# Patient Record
Sex: Male | Born: 1980 | Race: Black or African American | Hispanic: No | Marital: Single | Smoking: Current some day smoker
Health system: Southern US, Community
[De-identification: ages and names within clinical notes are randomized; demographics above are authoritative.]

---

## 2003-11-06 ENCOUNTER — Emergency Department: Payer: Self-pay | Admitting: Emergency Medicine

## 2003-11-13 ENCOUNTER — Emergency Department: Payer: Self-pay | Admitting: Emergency Medicine

## 2004-11-05 ENCOUNTER — Emergency Department: Payer: Self-pay | Admitting: Unknown Physician Specialty

## 2004-12-16 ENCOUNTER — Emergency Department: Payer: Self-pay | Admitting: Emergency Medicine

## 2007-07-06 ENCOUNTER — Emergency Department: Payer: Self-pay | Admitting: Emergency Medicine

## 2008-06-17 ENCOUNTER — Emergency Department: Payer: Self-pay | Admitting: Unknown Physician Specialty

## 2008-12-19 ENCOUNTER — Emergency Department: Payer: Self-pay | Admitting: Emergency Medicine

## 2012-02-10 ENCOUNTER — Emergency Department: Payer: Self-pay | Admitting: Emergency Medicine

## 2013-04-15 ENCOUNTER — Emergency Department: Payer: Self-pay | Admitting: Emergency Medicine

## 2014-03-31 ENCOUNTER — Emergency Department: Payer: Self-pay | Admitting: Emergency Medicine

## 2014-04-20 ENCOUNTER — Emergency Department
Admit: 2014-04-20 | Disposition: A | Discharge status: Home / Self Care | Payer: Self-pay | Admitting: Emergency Medicine

## 2014-06-20 ENCOUNTER — Emergency Department
Admission: EM | Admit: 2014-06-20 | Discharge: 2014-06-20 | Disposition: A | Discharge status: Home / Self Care | Payer: Self-pay | Attending: Emergency Medicine | Admitting: Emergency Medicine

## 2014-06-20 ENCOUNTER — Encounter: Payer: Self-pay | Admitting: Emergency Medicine

## 2014-06-20 DIAGNOSIS — Z720 Tobacco use: Secondary | ICD-10-CM | POA: Insufficient documentation

## 2014-06-20 DIAGNOSIS — H6122 Impacted cerumen, left ear: Secondary | ICD-10-CM | POA: Insufficient documentation

## 2014-06-20 NOTE — Discharge Instructions (Signed)
USE OTC Debrox for ear was removal. Cerumen Impaction A cerumen impaction is when the wax in your ear forms a plug. This plug usually causes reduced hearing. Sometimes it also causes an earache or dizziness. Removing a cerumen impaction can be difficult and painful. The wax sticks to the ear canal. The canal is sensitive and bleeds easily. If you try to remove a heavy wax buildup with a cotton tipped swab, you may push it in further. Irrigation with water, suction, and small ear curettes may be used to clear out the wax. If the impaction is fixed to the skin in the ear canal, ear drops may be needed for a few days to loosen the wax. People who build up a lot of wax frequently can use ear wax removal products available in your local drugstore. SEEK MEDICAL CARE IF:  You develop an earache, increased hearing loss, or marked dizziness. Document Released: 02/15/2004 Document Revised: 04/01/2011 Document Reviewed: 04/06/2009 [facility] Patient Information 2015 [place], [place]. This information is not intended to replace advice given to you by your health care provider. Make sure you discuss any questions you have with your health care provider.

## 2014-06-20 NOTE — ED Notes (Signed)
Left ear irrigated with warm saline  Tolerated well  Mod amt of wax retruned

## 2014-06-20 NOTE — ED Notes (Signed)
C/o left ear pain and difficulty hearing out of left ear

## 2014-06-20 NOTE — ED Provider Notes (Signed)
[facility] Emergency Department Provider Note  ____________________________________________  Time seen: Approximately 11:03 AM  I have reviewed the triage vital signs and the nursing notes.   HISTORY  Chief Complaint Otalgia    HPI Evan Martin is a 34 y.o. male complaining of 4 days of decreased hearing and pain in the left ear. Patient denies any URI signs symptoms. Patient is rating his pain as a 10 over 10. Patient stated he has been no palliative or provocative incidents for his complaint. She describes pain as sharp and a fullness in his ear.   History reviewed. No pertinent past medical history.  There are no active problems to display for this patient.   History reviewed. No pertinent past surgical history.  No current outpatient prescriptions on file.  Allergies Review of patient's allergies indicates no known allergies.  History reviewed. No pertinent family history.  Social History History  Substance Use Topics  . Smoking status: Current Some Day Smoker  . Smokeless tobacco: Not on file  . Alcohol Use: Yes    Review of Systems Constitutional: No fever/chills Eyes: No visual changes. ENT: No sore throat. States decreased affected left ear only. Cardiovascular: Denies chest pain. Respiratory: Denies shortness of breath. Gastrointestinal: No abdominal pain.  No nausea, no vomiting.  No diarrhea.  No constipation. Genitourinary: Negative for dysuria. Musculoskeletal: Negative for back pain. Skin: Negative for rash. Neurological: Negative for headaches, focal weakness or numbness. 10-point ROS otherwise negative.  ____________________________________________   PHYSICAL EXAM:  VITAL SIGNS: ED Triage Vitals  Enc Vitals Group     BP 06/20/14 1039 112/68 mmHg     Pulse Rate 06/20/14 1039 64     Resp 06/20/14 1039 18     Temp 06/20/14 1039 98.4 F (36.9 C)     Temp Source 06/20/14 1039 Oral     SpO2 06/20/14 1039 97 %    Weight 06/20/14 1039 187 lb (84.823 kg)     Height 06/20/14 1039  (1.905 m)     Head Cir --      Peak Flow --      Pain Score 06/20/14 1037 10     Pain Loc --      Pain Edu? --      Excl. in GC? --     Constitutional: Alert and oriented. Well appearing and in no acute distress. Eyes: Conjunctivae are normal. PERRL. EOMI. Head: Atraumatic. Nose: No congestion/rhinnorhea. Mouth/Throat: Mucous membranes are moist.  Oropharynx non-erythematous. Left ear canal with buildup of wax TM not visible. Right ear canal and TM are unremarkable. Neck: No stridor.  No cervical deformity for nuchal range of motion nontender palpation. Hematological/Lymphatic/Immunilogical: No cervical lymphadenopathy. Cardiovascular: Normal rate, regular rhythm. Grossly normal heart sounds.  Good peripheral circulation. Respiratory: Normal respiratory effort.  No retractions. Lungs CTAB. Gastrointestinal: Soft and nontender. No distention. No abdominal bruits. No CVA tenderness. Musculoskeletal: No lower extremity tenderness nor edema.  No joint effusions. Neurologic:  Normal speech and language. No gross focal neurologic deficits are appreciated. Speech is normal. No gait instability. Skin:  Skin is warm, dry and intact. No rash noted. Psychiatric: Mood and affect are normal. Speech and behavior are normal.  ____________________________________________   LABS (all labs ordered are listed, but only abnormal results are displayed)  Labs Reviewed - No data to display ____________________________________________  EKG   ____________________________________________  RADIOLOGY   ____________________________________________   PROCEDURES  Procedure(s) performed: None  Critical Care performed: No  ____________________________________________  INITIAL IMPRESSION / ASSESSMENT AND PLAN / ED COURSE  Pertinent labs & imaging results that were available during my care of the patient were reviewed by me  and considered in my medical decision making (see chart for details).  Cerumen impaction ____________________________________________   FINAL CLINICAL IMPRESSION(S) / ED DIAGNOSES  Final diagnoses:  Cerumen impaction, left      Evan ReiningRonald K Magali Bray, PA-C 06/20/14 1112  Phineas SemenGraydon Goodman, MD 06/20/14 1124

## 2014-07-05 ENCOUNTER — Ambulatory Visit: Payer: Self-pay | Attending: Orthopedic Surgery | Admitting: Occupational Therapy

## 2014-07-07 ENCOUNTER — Ambulatory Visit: Payer: Self-pay | Admitting: Occupational Therapy

## 2015-10-14 ENCOUNTER — Emergency Department
Admission: EM | Admit: 2015-10-14 | Discharge: 2015-10-14 | Disposition: A | Discharge status: Home / Self Care | Payer: PRIVATE HEALTH INSURANCE | Attending: Emergency Medicine | Admitting: Emergency Medicine

## 2015-10-14 ENCOUNTER — Encounter: Payer: Self-pay | Admitting: *Deleted

## 2015-10-14 DIAGNOSIS — F17200 Nicotine dependence, unspecified, uncomplicated: Secondary | ICD-10-CM | POA: Insufficient documentation

## 2015-10-14 DIAGNOSIS — H00012 Hordeolum externum right lower eyelid: Secondary | ICD-10-CM | POA: Insufficient documentation

## 2015-10-14 DIAGNOSIS — H578 Other specified disorders of eye and adnexa: Secondary | ICD-10-CM | POA: Diagnosis present

## 2015-10-14 DIAGNOSIS — H00023 Hordeolum internum right eye, unspecified eyelid: Secondary | ICD-10-CM

## 2015-10-14 MED ORDER — EYE WASH OPHTH SOLN
OPHTHALMIC | Status: DC
Start: 2015-10-14 — End: 2015-10-14
  Filled 2015-10-14: qty 118

## 2015-10-14 MED ORDER — HYDROXYZINE HCL 50 MG PO TABS: 50.0000 mg | ORAL_TABLET | Freq: Three times a day (TID) | ORAL | 0 refills | Status: DC | PRN

## 2015-10-14 MED ORDER — GENTAMICIN SULFATE 0.3 % OP SOLN: 1.0000 [drp] | OPHTHALMIC | 0 refills | Status: DC

## 2015-10-14 MED ORDER — TETRACAINE HCL 0.5 % OP SOLN
OPHTHALMIC | Status: AC
  Filled 2015-10-14: qty 2

## 2015-10-14 NOTE — ED Provider Notes (Signed)
[facility] Provider Note   ____________________________________________   First MD Initiated Contact with Patient 10/14/15 1346     (approximate)  I have reviewed the triage vital signs and the nursing notes.   HISTORY  Chief Complaint Eye Drainage    HPI Evan Martin is a 35 y.o. male patient present with right eye swollen swelling for 5 days secondary to a sty. Patient stating he's been using over-the-counter medication. With the conditions worsen. Patient is a week and this morning with eye swollen shut and a yellowish drainage. Patient states only loss of vision secondary to his extremities. Patient rates his pain as a 2/10. Patient describes pain as "achy". No other palliative measures for this complaint.   History reviewed. No pertinent past medical history.  There are no active problems to display for this patient.   History reviewed. No pertinent surgical history.  Prior to Admission medications   Medication Sig Start Date End Date Taking? Authorizing Provider  gentamicin (GARAMYCIN) 0.3 % ophthalmic solution Place 1 drop into both eyes every 4 (four) hours. 10/14/15   Joni Reiningonald K Smith, PA-C  hydrOXYzine (ATARAX/VISTARIL) 50 MG tablet Take 1 tablet (50 mg total) by mouth 3 (three) times daily as needed for itching. 10/14/15   Joni Reiningonald K Smith, PA-C    Allergies Review of patient's allergies indicates no known allergies.  No family history on file.  Social History Social History  Substance Use Topics  . Smoking status: Current Some Day Smoker  . Smokeless tobacco: Not on file  . Alcohol use Yes    Review of Systems Constitutional: No fever/chills Eyes: No visual changes. ENT: No sore throat. Matted eyelids for greenish discharge. Cardiovascular: Denies chest pain. Respiratory: Denies shortness of breath. Gastrointestinal: No abdominal pain.  No nausea, no vomiting.  No diarrhea.  No constipation. Genitourinary:  Negative for dysuria. Musculoskeletal: Negative for back pain. Skin: Negative for rash. Neurological: Negative for headaches, focal weakness or numbness.   ____________________________________________   PHYSICAL EXAM:  VITAL SIGNS: ED Triage Vitals  Enc Vitals Group     BP 10/14/15 1151 129/73     Pulse Rate 10/14/15 1151 60     Resp 10/14/15 1151 20     Temp 10/14/15 1151 98.1 F (36.7 C)     Temp Source 10/14/15 1151 Oral     SpO2 10/14/15 1151 99 %     Weight 10/14/15 1151 188 lb (85.3 kg)     Height 10/14/15 1151 6\' 3"  (1.905 m)     Head Circumference --      Peak Flow --      Pain Score 10/14/15 1152 2     Pain Loc --      Pain Edu? --      Excl. in GC? --     Constitutional: Alert and oriented. Well appearing and in no acute distress. Eyes: Matted right eyelids. Internal lower eyelid papular lesion. Copious amount of yellow greenish discharge. Head: Atraumatic. Nose: No congestion/rhinnorhea. Mouth/Throat: Mucous membranes are moist.  Oropharynx non-erythematous. Neck: No stridor.  No cervical spine tenderness to palpation. Hematological/Lymphatic/Immunilogical: No cervical lymphadenopathy. Cardiovascular: Normal rate, regular rhythm. Grossly normal heart sounds.  Good peripheral circulation. Respiratory: Normal respiratory effort.  No retractions. Lungs CTAB. Gastrointestinal: Soft and nontender. No distention. No abdominal bruits. No CVA tenderness. usculoskeletal: No lower extremity tenderness nor edema.  No joint effusions. Neurologic:  Normal speech and language. No gross focal neurologic deficits are appreciated. No gait instability.  Skin:  Skin is warm, dry and intact. No rash noted. Psychiatric: Mood and affect are normal. Speech and behavior are normal.  ____________________________________________   LABS (all labs ordered are listed, but only abnormal results are displayed)  Labs Reviewed - No data to  display ____________________________________________  EKG   ____________________________________________  RADIOLOGY   ____________________________________________   PROCEDURES  Procedure(s) performed: None  Procedures  Critical Care performed: No  ____________________________________________   INITIAL IMPRESSION / ASSESSMENT AND PLAN / ED COURSE  Pertinent labs & imaging results that were available during my care of the patient were reviewed by me and considered in my medical decision making (see chart for details).  Internal stye right eye. Patient given discharge Instructions. Patient given a prescription for gentamicin and Atarax. Patient advised follow-up with [facility] eye clinic if no improvement in 2 days.  Clinical Course  Tetracaine was used to numb the eye, copious irrigation to remove drainage.   ____________________________________________   FINAL CLINICAL IMPRESSION(S) / ED DIAGNOSES  Final diagnoses:  Sty, internal, right      NEW MEDICATIONS STARTED DURING THIS VISIT:  Discharge Medication List as of 10/14/2015  2:14 PM    START taking these medications   Details  gentamicin (GARAMYCIN) 0.3 % ophthalmic solution Place 1 drop into both eyes every 4 (four) hours., Starting Sat 10/14/2015, Print    hydrOXYzine (ATARAX/VISTARIL) 50 MG tablet Take 1 tablet (50 mg total) by mouth 3 (three) times daily as needed for itching., Starting Sat 10/14/2015, Print         Note:  This document was prepared using Dragon voice recognition software and may include unintentional dictation errors.    Joni Reining, PA-C 10/14/15 1424    Arnaldo Natal, MD 10/14/15 [phone]

## 2015-10-14 NOTE — ED Triage Notes (Signed)
Pt reports having a stye in right eye starting 5 days ago, pt reports drainage and swelling have gotten worse , eye is swollen shut with yellow drainage

## 2016-03-07 ENCOUNTER — Encounter: Payer: Self-pay | Admitting: Emergency Medicine

## 2016-03-07 ENCOUNTER — Emergency Department
Admission: EM | Admit: 2016-03-07 | Discharge: 2016-03-07 | Disposition: A | Discharge status: Home / Self Care | Payer: PRIVATE HEALTH INSURANCE | Attending: Emergency Medicine | Admitting: Emergency Medicine

## 2016-03-07 DIAGNOSIS — S025XXB Fracture of tooth (traumatic), initial encounter for open fracture: Secondary | ICD-10-CM

## 2016-03-07 DIAGNOSIS — Y929 Unspecified place or not applicable: Secondary | ICD-10-CM | POA: Insufficient documentation

## 2016-03-07 DIAGNOSIS — F17200 Nicotine dependence, unspecified, uncomplicated: Secondary | ICD-10-CM | POA: Insufficient documentation

## 2016-03-07 DIAGNOSIS — K047 Periapical abscess without sinus: Secondary | ICD-10-CM | POA: Insufficient documentation

## 2016-03-07 DIAGNOSIS — X58XXXA Exposure to other specified factors, initial encounter: Secondary | ICD-10-CM | POA: Insufficient documentation

## 2016-03-07 DIAGNOSIS — Y999 Unspecified external cause status: Secondary | ICD-10-CM | POA: Insufficient documentation

## 2016-03-07 DIAGNOSIS — Y9389 Activity, other specified: Secondary | ICD-10-CM | POA: Insufficient documentation

## 2016-03-07 DIAGNOSIS — S025XXA Fracture of tooth (traumatic), initial encounter for closed fracture: Secondary | ICD-10-CM | POA: Insufficient documentation

## 2016-03-07 MED ORDER — DEXAMETHASONE 4 MG PO TABS: ORAL_TABLET | ORAL | 0 refills | Status: DC

## 2016-03-07 MED ORDER — KETOROLAC TROMETHAMINE 30 MG/ML IJ SOLN
30.0000 mg | Freq: Once | INTRAMUSCULAR | Status: AC
  Administered 2016-03-07: 30 mg via INTRAMUSCULAR
  Filled 2016-03-07: qty 1

## 2016-03-07 MED ORDER — LIDOCAINE VISCOUS 2 % MT SOLN: 10.0000 mL | OROMUCOSAL | 0 refills | Status: DC | PRN

## 2016-03-07 MED ORDER — AMOXICILLIN 500 MG PO TABS: 500.0000 mg | ORAL_TABLET | Freq: Three times a day (TID) | ORAL | 0 refills | Status: DC

## 2016-03-07 NOTE — ED Provider Notes (Signed)
[facility]  ____________________________________________  Time seen: Approximately 9:58 AM  I have reviewed the triage vital signs and the nursing notes.   HISTORY  Chief Complaint Dental Pain    HPI Evan Martin is a 36 y.o. male , NAD, presents to the emergency department with 2 day history of left lower dental pain and swelling. Patient states he was chewing on a "hot ball" a few days ago that cause one of his left lower teeth to break. The last 48 hours he has had increasing pain and swelling lateral to that tooth. Denies any swelling of the lip/tongue/throat. Has had no bleeding, oozing, weeping about the area. Has felt feverish with chills but no body aches, abdominal pain, nausea or vomiting. Has not noted any redness, abnormal warmth or skin sores about his face or neck. Denies any injury or trauma to the face or neck. Has had no difficulty eating or drinking or swallowing.   History reviewed. No pertinent past medical history.  There are no active problems to display for this patient.   History reviewed. No pertinent surgical history.  Prior to Admission medications   Medication Sig Start Date End Date Taking? Authorizing Provider  amoxicillin (AMOXIL) 500 MG tablet Take 1 tablet (500 mg total) by mouth 3 (three) times daily with meals. 03/07/16   Jami L Hagler, PA-C  dexamethasone (DECADRON) 4 MG tablet Take 6 tablets on Day 1 with food, then decrease by 1 tablet daily until finished (6,5,4,3,2,1) 03/07/16   Jami L Hagler, PA-C  lidocaine (XYLOCAINE) 2 % solution Use as directed 10 mLs in the mouth or throat every 4 (four) hours as needed for mouth pain. 03/07/16   Jami L Hagler, PA-C    Allergies Patient has no known allergies.  No family history on file.  Social History Social History  Substance Use Topics  . Smoking status: Current Some Day Smoker  . Smokeless tobacco: Never Used  . Alcohol use Yes      Review of Systems  Constitutional: Positive subjective fevers with chills. ENT: Positive dental pain with swelling about the left lower jawline. No sore throat, swelling about the lips/tongue/throat. Cardiovascular: No chest pain. Respiratory: No shortness of breath.  Gastrointestinal: No abdominal pain.  No nausea, vomiting.   Musculoskeletal: Negative for general myalgias, neck pain.  Skin: Negative for rash. Neurological: Negative for headaches. 10-point ROS otherwise negative.  ____________________________________________   PHYSICAL EXAM:  VITAL SIGNS: ED Triage Vitals [03/07/16 0956]  Enc Vitals Group     BP 121/76     Pulse Rate (!) 51     Resp 20     Temp 98.1 F (36.7 C)     Temp Source Oral     SpO2 99 %     Weight 190 lb (86.2 kg)     Height 6\' 3"  (1.905 m)     Head Circumference      Peak Flow      Pain Score 7     Pain Loc      Pain Edu?      Excl. in GC?      Constitutional: Alert and oriented. Well appearing and in no acute distress. Eyes: Conjunctivae are normal.  Head: Atraumatic. ENT:      Nose: No congestion/rhinnorhea.      Mouth/Throat: Mucous membranes are moist. Poor dentition throughout. Tooth #19 with significant cavities and frontal portion of the tooth is missing. No jagged edges. Gumline from  tooth #18-21 is swollen but nontender. Swelling with tenderness noted about the left lower cheek line without abnormal warmth or redness. Pharynx without erythema, swelling, exudate. Uvula is midline. Airway is patent. Neck: No stridor. Supple with full range of motion. Hematological/Lymphatic/Immunilogical: Positive right, anterior, shotty cervical lymphadenopathy with mild tenderness to palpation but all are mobile. Cardiovascular:  Good peripheral circulation. Respiratory: Normal respiratory effort without tachypnea or retractions.  Musculoskeletal: No TMJ pain Neurologic:  Normal speech and language. No gross focal neurologic deficits are  appreciated.  Skin:  Skin is warm, dry and intact. No rash noted. Psychiatric: Mood and affect are normal. Speech and behavior are normal. Patient exhibits appropriate insight and judgement.   ____________________________________________   LABS  None ____________________________________________  EKG  None ____________________________________________  RADIOLOGY  None ____________________________________________    PROCEDURES  Procedure(s) performed: None   Procedures   Medications  ketorolac (TORADOL) 30 MG/ML injection 30 mg (30 mg Intramuscular Given 03/07/16 1005)     ____________________________________________   INITIAL IMPRESSION / ASSESSMENT AND PLAN / ED COURSE  Pertinent labs & imaging results that were available during my care of the patient were reviewed by me and considered in my medical decision making (see chart for details).     Patient's diagnosis is consistent with Dental infection due to open fracture of tooth. Patient was given IM Toradol while in the emergency department and tolerated well. Patient will be discharged home with prescriptions for amoxicillin, Decadron and Xylocaine this is to take as directed. Patient is to follow up with [facility] dental clinic or any other local dental clinic if symptoms persist past this treatment course. Patient is given ED precautions to return to the ED for any worsening or new symptoms.   ____________________________________________  FINAL CLINICAL IMPRESSION(S) / ED DIAGNOSES  Final diagnoses:  Dental infection  Open fracture of tooth, initial encounter      NEW MEDICATIONS STARTED DURING THIS VISIT:  Discharge Medication List as of 03/07/2016 10:00 AM    START taking these medications   Details  amoxicillin (AMOXIL) 500 MG tablet Take 1 tablet (500 mg total) by mouth 3 (three) times daily with meals., Starting Thu 03/07/2016, Print    dexamethasone (DECADRON) 4 MG  tablet Take 6 tablets on Day 1 with food, then decrease by 1 tablet daily until finished (6,5,4,3,2,1), Print    lidocaine (XYLOCAINE) 2 % solution Use as directed 10 mLs in the mouth or throat every 4 (four) hours as needed for mouth pain., Starting Thu 03/07/2016, Print             Ernestene Kiel [place]na, PA-C 03/07/16 1103    Jene Every, MD 03/07/16 1140

## 2016-03-07 NOTE — ED Triage Notes (Signed)
States he broke a tooth about 3-4 days ago  Swelling to left jaw area yesterday
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
## 2018-12-15 ENCOUNTER — Other Ambulatory Visit: Payer: Self-pay

## 2018-12-15 ENCOUNTER — Ambulatory Visit: Payer: Self-pay

## 2018-12-15 ENCOUNTER — Ambulatory Visit: Payer: Self-pay | Admitting: Physician Assistant

## 2018-12-15 DIAGNOSIS — Z202 Contact with and (suspected) exposure to infections with a predominantly sexual mode of transmission: Secondary | ICD-10-CM

## 2018-12-15 DIAGNOSIS — Z113 Encounter for screening for infections with a predominantly sexual mode of transmission: Secondary | ICD-10-CM

## 2018-12-15 MED ORDER — AZITHROMYCIN 500 MG PO TABS
1000.0000 mg | ORAL_TABLET | Freq: Once | ORAL | Status: AC
  Administered 2018-12-15: 1000 mg via ORAL

## 2018-12-16 ENCOUNTER — Encounter: Payer: Self-pay | Admitting: Physician Assistant

## 2018-12-16 NOTE — Progress Notes (Signed)
    STI clinic/screening visit  Subjective:  Evan Martin is a 38 y.o. male being seen today for an STI screening visit. The patient reports they do not have symptoms.   Patient has the following medical conditions:  There are no active problems to display for this patient.    Chief Complaint  Patient presents with  . SEXUALLY TRANSMITTED DISEASE    HPI  Patient reports that he is not having any symptoms but is a contact to Chlamydia.  Declines blood work and screening exam.  Requests treatment only today.  See flowsheet for further details and programmatic requirements.    The following portions of the patient's history were reviewed and updated as appropriate: allergies, current medications, past medical history, past social history, past surgical history and problem list.  Objective:  There were no vitals filed for this visit.  Physical Exam Constitutional:      General: He is not in acute distress.    Appearance: Normal appearance.  HENT:     Head: Normocephalic and atraumatic.  Pulmonary:     Effort: Pulmonary effort is normal.  Neurological:     Mental Status: He is alert and oriented to person, place, and time.  Psychiatric:        Mood and Affect: Mood normal.        Behavior: Behavior normal.        Thought Content: Thought content normal.        Judgment: Judgment normal.       Assessment and Plan:  Evan Martin is a 38 y.o. male presenting to the [facility] Department for STI screening  1. Screening for STD (sexually transmitted disease) Patient into clinic requesting treatment as a contact to Chlamydia. Declines blood work and screening today. Rec condoms with all sex.   2. Chlamydia contact Will treat with Azithromycin 1 g po DOT today. No sex for 7 days and until after partner completes treatment. RTC if vomits < 2 hr after taking medicine for re-treatment. - azithromycin (ZITHROMAX) tablet 1,000 mg     No follow-ups on  file.  No future appointments.  Jerene Dilling, PA

## 2019-03-09 ENCOUNTER — Emergency Department: Payer: No Typology Code available for payment source

## 2019-03-09 ENCOUNTER — Other Ambulatory Visit: Payer: Self-pay

## 2019-03-09 ENCOUNTER — Emergency Department
Admission: EM | Admit: 2019-03-09 | Discharge: 2019-03-09 | Disposition: A | Discharge status: Home / Self Care | Payer: No Typology Code available for payment source | Attending: Student | Admitting: Student

## 2019-03-09 ENCOUNTER — Encounter: Payer: Self-pay | Admitting: Medical Oncology

## 2019-03-09 DIAGNOSIS — S39012A Strain of muscle, fascia and tendon of lower back, initial encounter: Secondary | ICD-10-CM | POA: Insufficient documentation

## 2019-03-09 DIAGNOSIS — Y999 Unspecified external cause status: Secondary | ICD-10-CM | POA: Insufficient documentation

## 2019-03-09 DIAGNOSIS — F17290 Nicotine dependence, other tobacco product, uncomplicated: Secondary | ICD-10-CM | POA: Diagnosis not present

## 2019-03-09 DIAGNOSIS — Y93I9 Activity, other involving external motion: Secondary | ICD-10-CM | POA: Insufficient documentation

## 2019-03-09 DIAGNOSIS — Y92410 Unspecified street and highway as the place of occurrence of the external cause: Secondary | ICD-10-CM | POA: Insufficient documentation

## 2019-03-09 DIAGNOSIS — S161XXA Strain of muscle, fascia and tendon at neck level, initial encounter: Secondary | ICD-10-CM | POA: Diagnosis not present

## 2019-03-09 DIAGNOSIS — S3992XA Unspecified injury of lower back, initial encounter: Secondary | ICD-10-CM | POA: Diagnosis present

## 2019-03-09 MED ORDER — BACLOFEN 10 MG PO TABS: 10.0000 mg | ORAL_TABLET | Freq: Every day | ORAL | 1 refills | Status: DC

## 2019-03-09 MED ORDER — MELOXICAM 15 MG PO TABS: 15.0000 mg | ORAL_TABLET | Freq: Every day | ORAL | 2 refills | Status: DC

## 2019-03-09 NOTE — ED Triage Notes (Signed)
Pt reports he was in [facility] Sunday night and since then he has been having pain to lower back.
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
## 2019-03-09 NOTE — Discharge Instructions (Addendum)
Take the medication as needed.  Return emergency department for worsening.  Follow-up with orthopedics if not better in 1 week.
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
## 2019-09-23 ENCOUNTER — Other Ambulatory Visit: Payer: Self-pay

## 2019-09-23 ENCOUNTER — Emergency Department: Payer: Self-pay

## 2019-09-23 ENCOUNTER — Emergency Department
Admission: EM | Admit: 2019-09-23 | Discharge: 2019-09-23 | Disposition: A | Discharge status: Home / Self Care | Payer: Self-pay | Attending: Emergency Medicine | Admitting: Emergency Medicine

## 2019-09-23 ENCOUNTER — Encounter: Payer: Self-pay | Admitting: Emergency Medicine

## 2019-09-23 DIAGNOSIS — Y939 Activity, unspecified: Secondary | ICD-10-CM | POA: Insufficient documentation

## 2019-09-23 DIAGNOSIS — Y929 Unspecified place or not applicable: Secondary | ICD-10-CM | POA: Insufficient documentation

## 2019-09-23 DIAGNOSIS — S80811A Abrasion, right lower leg, initial encounter: Secondary | ICD-10-CM | POA: Insufficient documentation

## 2019-09-23 DIAGNOSIS — F17290 Nicotine dependence, other tobacco product, uncomplicated: Secondary | ICD-10-CM | POA: Insufficient documentation

## 2019-09-23 DIAGNOSIS — S8391XA Sprain of unspecified site of right knee, initial encounter: Secondary | ICD-10-CM | POA: Insufficient documentation

## 2019-09-23 DIAGNOSIS — Y999 Unspecified external cause status: Secondary | ICD-10-CM | POA: Insufficient documentation

## 2019-09-23 MED ORDER — TRAMADOL HCL 50 MG PO TABS: 50.0000 mg | ORAL_TABLET | Freq: Two times a day (BID) | ORAL | 0 refills | Status: AC

## 2019-09-23 MED ORDER — IBUPROFEN 800 MG PO TABS: 800.0000 mg | ORAL_TABLET | Freq: Three times a day (TID) | ORAL | 0 refills | Status: DC | PRN

## 2019-09-23 MED ORDER — BACITRACIN-NEOMYCIN-POLYMYXIN 400-5-5000 EX OINT
TOPICAL_OINTMENT | Freq: Once | CUTANEOUS | Status: DC
  Filled 2019-09-23: qty 3
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
## 2019-09-23 NOTE — Discharge Instructions (Addendum)
Your exam and XR are normal at this time. Keep the wounds clean and dry and covered with antibiotic ointment. Take the prescription meds as directed. Wear the ace bandage as needed.

## 2019-09-23 NOTE — ED Notes (Signed)
Placed triple antibiotic ointment on the patients knee wound, wrapped with Telfa and 3 inch cling wrap. Ace bandage covering all.
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
## 2019-10-29 ENCOUNTER — Emergency Department: Payer: Self-pay

## 2019-10-29 ENCOUNTER — Emergency Department
Admission: EM | Admit: 2019-10-29 | Discharge: 2019-10-29 | Disposition: A | Discharge status: Home / Self Care | Payer: Self-pay | Attending: Emergency Medicine | Admitting: Emergency Medicine

## 2019-10-29 ENCOUNTER — Encounter: Payer: Self-pay | Admitting: Emergency Medicine

## 2019-10-29 ENCOUNTER — Other Ambulatory Visit: Payer: Self-pay

## 2019-10-29 DIAGNOSIS — S92315A Nondisplaced fracture of first metatarsal bone, left foot, initial encounter for closed fracture: Secondary | ICD-10-CM | POA: Insufficient documentation

## 2019-10-29 DIAGNOSIS — Y9355 Activity, bike riding: Secondary | ICD-10-CM | POA: Insufficient documentation

## 2019-10-29 DIAGNOSIS — S92902A Unspecified fracture of left foot, initial encounter for closed fracture: Secondary | ICD-10-CM

## 2019-10-29 DIAGNOSIS — Y92093 Driveway of other non-institutional residence as the place of occurrence of the external cause: Secondary | ICD-10-CM | POA: Insufficient documentation

## 2019-10-29 DIAGNOSIS — F17210 Nicotine dependence, cigarettes, uncomplicated: Secondary | ICD-10-CM | POA: Insufficient documentation

## 2019-10-29 DIAGNOSIS — S99922A Unspecified injury of left foot, initial encounter: Secondary | ICD-10-CM | POA: Diagnosis present

## 2019-10-29 MED ORDER — HYDROCODONE-ACETAMINOPHEN 5-325 MG PO TABS: 1.0000 | ORAL_TABLET | Freq: Three times a day (TID) | ORAL | 0 refills | Status: AC | PRN

## 2019-10-29 MED ORDER — HYDROCODONE-ACETAMINOPHEN 5-325 MG PO TABS
1.0000 | ORAL_TABLET | Freq: Once | ORAL | Status: AC
  Administered 2019-10-29: 1 via ORAL
  Filled 2019-10-29: qty 1
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
## 2019-10-29 NOTE — Discharge Instructions (Addendum)
You are being treated for a foot sprain and fracture of the bone of the first toe.  You should wear the cam walker and Ace bandage for support.  You should not bear weight through the foot at this time.  You should call Dr. Rosetta Posner at his office on Monday, to schedule follow-up appointment.  Take the pain medicine as needed.  Rest with the foot elevated and apply ice to reduce swelling.  Return to the ED if needed.
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
## 2020-11-13 ENCOUNTER — Ambulatory Visit: Payer: Self-pay

## 2022-02-24 IMAGING — CT CT FOOT*L* W/O CM
2 of 3 series · 8 of 35 positions shown · non-contrast
Comparison: Radiograph 10/29/2019
COMPARISON: Radiograph 10/29/2019

Addendum:
CLINICAL DATA: Foot fracture

EXAM:
CT OF THE LEFT FOOT WITHOUT CONTRAST
TECHNIQUE: Multidetector CT imaging of the left foot was performed according to
the standard protocol. Multiplanar CT image reconstructions were
also generated.

[Series 8: cor st · coronal · 0.27mm/px · 2 of 308 slices shown]
[im 123/308  bone]
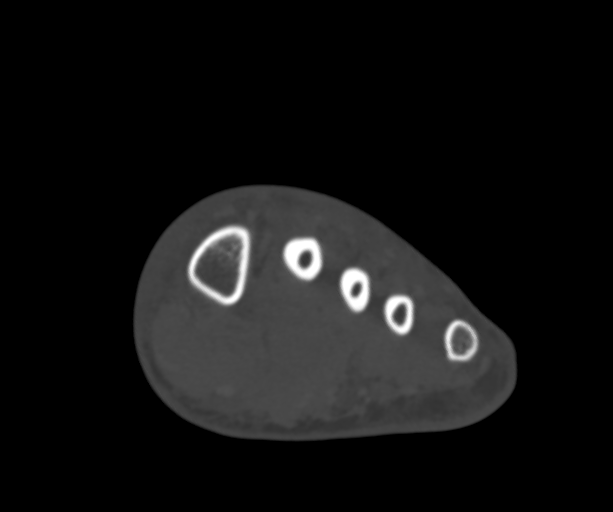
[im 205/308  bone]
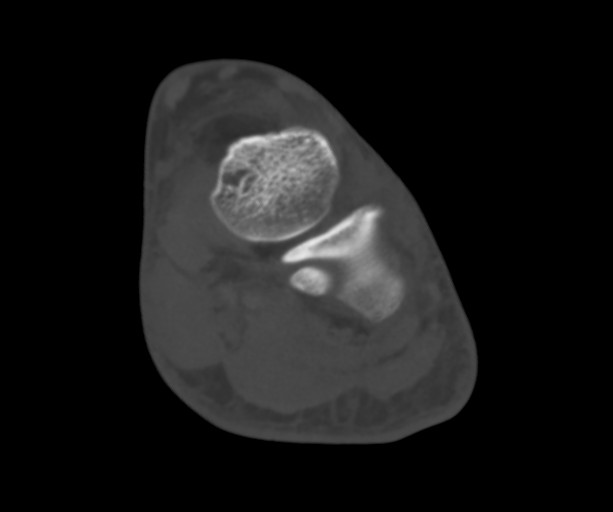

[Series 9: sag st · sagittal · 0.32mm/px · 6 of 162 slices shown]
[im 60/162  bone]
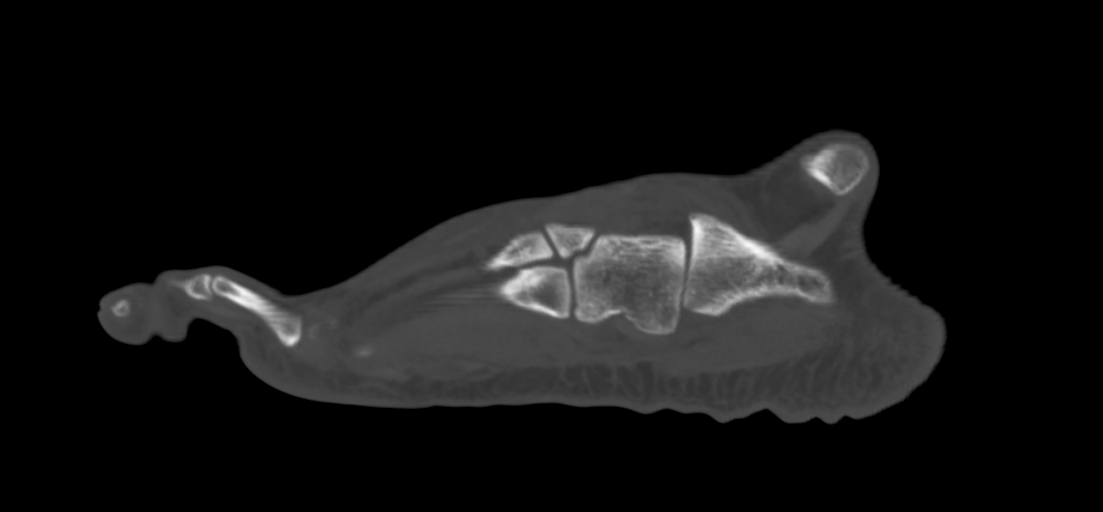
[im 70/162  bone]
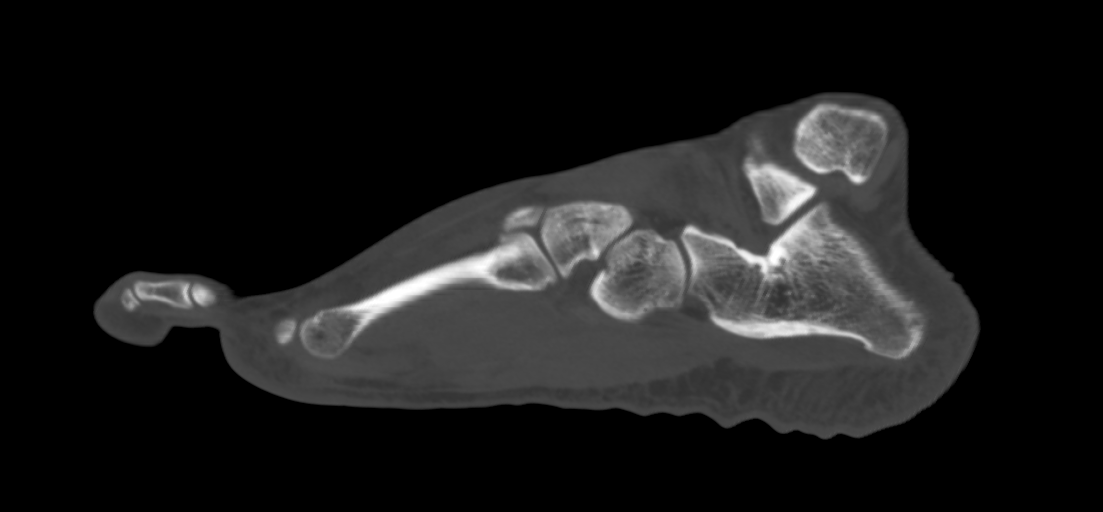
[im 80/162  soft-tissue]
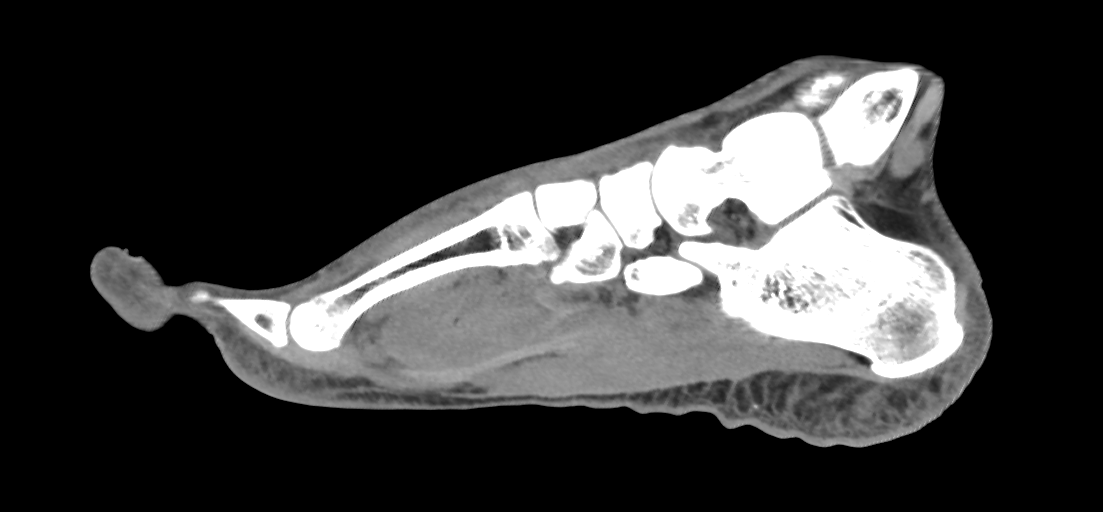
[im 81/162  bone]
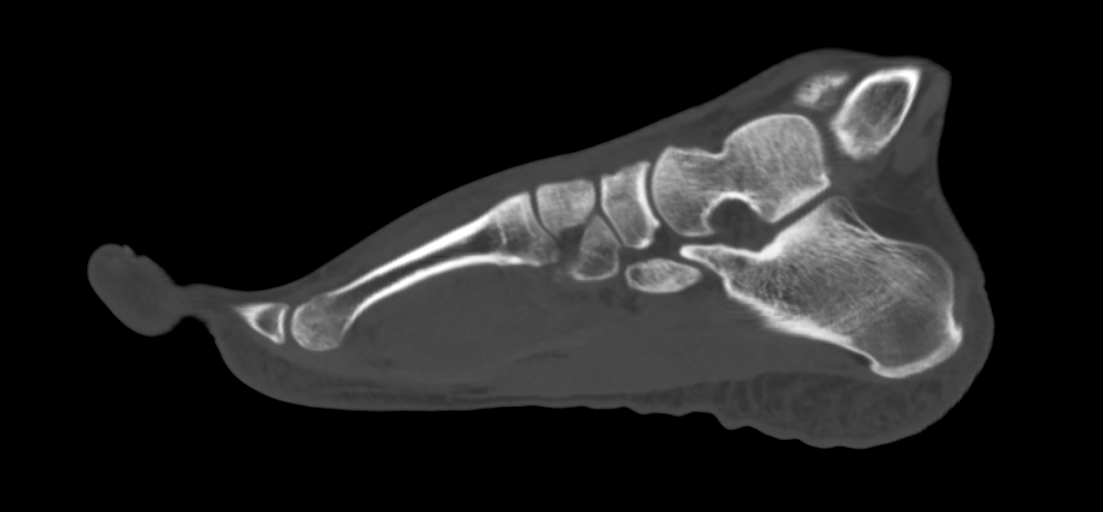
[im 92/162  bone]
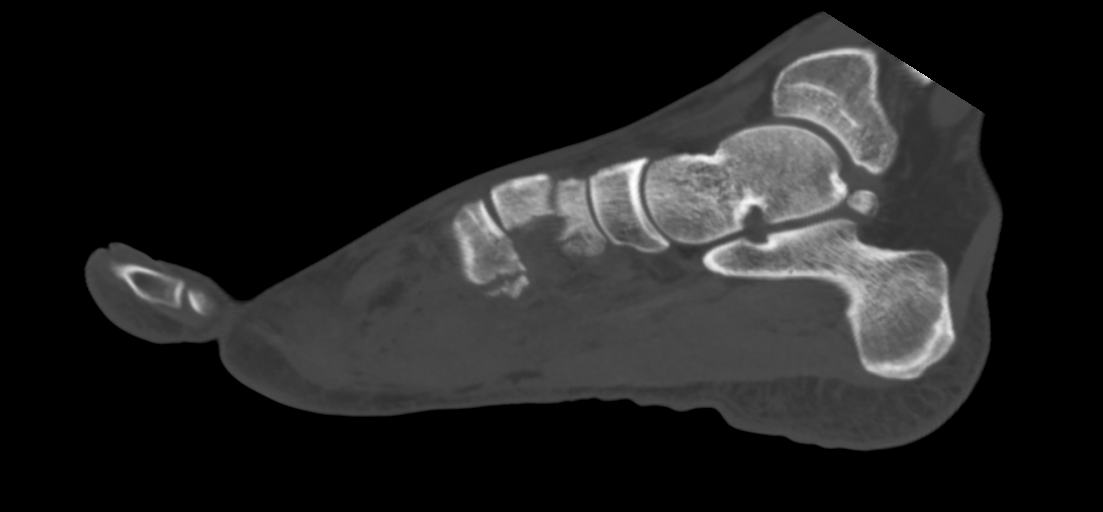
[im 103/162  bone]
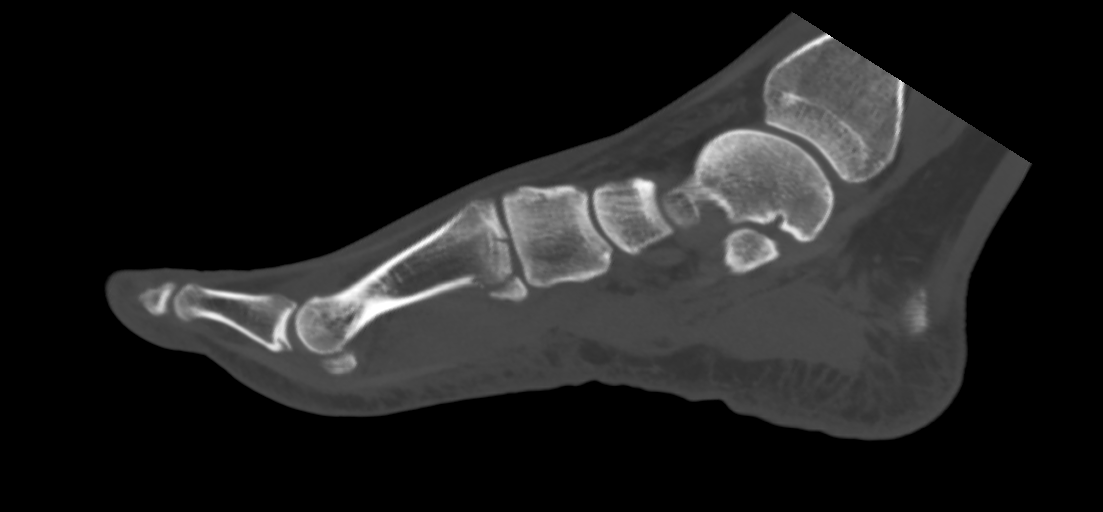

[8 of 35 positions shown; findings below may reference images not displayed]

FINDINGS: Bones/Joint/Cartilage

There is a comminuted fracture at the base of the first metacarpal
with intra-articular extension into the first carpometacarpal and
intercarpal articulation. Additional nondisplaced fractures
involving bases of the second and third metatarsals as well.

Possible nondisplaced fracture versus vascular channel at the volar
and lateral aspect of the medial cuneiform with questionable
widening of the Lisfranc interval up to approximately 9 mm
suspicious for Lisfranc injury.

Additional minimally displaced fracture involving the volar aspect
of the intermediate cuneiform.

No other acute fracture is seen.

Corticated os trigonum seen posteriorly. Mild arthrosis and spurring
about the hallucal sesamoidal complex.

Ligaments

Suboptimally assessed by CT. Suspected Lisfranc ligamentous injury
given widening between the base of the second metatarsal and medial
cuneiform as well as some asymmetric widening across the second
metatarsophalangeal joint.

Muscles and Tendons

Intramuscular thickening and stranding along the central plantar
compartment. No retracted or torn tendons are evident.

Soft tissues

Extensive soft tissue swelling noted along the plantar arch and
instep and at the level of the volar aspect of the metatarsal and
cuneiform fractures detailed above. No sizable ankle effusion.
IMPRESSION: 1. Comminuted fracture at the base of the first metacarpal with
intra-articular extension into the first carpometacarpal and
intercarpal articulation. Additional nondisplaced fractures
involving bases of the second and third metatarsals as well.
2. Questionable nondisplaced fracture versus vascular channel at the
volar and lateral aspect of the medial cuneiform and minimally
displaced fracture involving the volar aspect of the intermediate
cuneiform.
3. Widening of the Lisfranc interval up to approximately 9 mm
suspicious for Lisfranc injury particularly given the fracture
pattern as well as extent and mechanism of injury.
4. Extensive soft tissue swelling along the plantar arch and instep
and at the level of the volar aspect of the metatarsal and cuneiform
fractures. Intramuscular thickening and stranding along the central
plantar compartment. No retracted or torn tendons.

Currently attempting to contact the ordering provider with a
critical value result. Addendum will be submitted upon case
discussion.

ADDENDUM:
These results were called by telephone on 10/29/2019 at [DATE] to
provider CHAI TIGER , who verbally acknowledged these results.

*** End of Addendum ***
FINDINGS: Bones/Joint/Cartilage

There is a comminuted fracture at the base of the first metacarpal
with intra-articular extension into the first carpometacarpal and
intercarpal articulation. Additional nondisplaced fractures
involving bases of the second and third metatarsals as well.

Possible nondisplaced fracture versus vascular channel at the volar
and lateral aspect of the medial cuneiform with questionable
widening of the Lisfranc interval up to approximately 9 mm
suspicious for Lisfranc injury.

Additional minimally displaced fracture involving the volar aspect
of the intermediate cuneiform.

No other acute fracture is seen.

Corticated os trigonum seen posteriorly. Mild arthrosis and spurring
about the hallucal sesamoidal complex.

Ligaments

Suboptimally assessed by CT. Suspected Lisfranc ligamentous injury
given widening between the base of the second metatarsal and medial
cuneiform as well as some asymmetric widening across the second
metatarsophalangeal joint.

Muscles and Tendons

Intramuscular thickening and stranding along the central plantar
compartment. No retracted or torn tendons are evident.

Soft tissues

Extensive soft tissue swelling noted along the plantar arch and
instep and at the level of the volar aspect of the metatarsal and
cuneiform fractures detailed above. No sizable ankle effusion.
IMPRESSION: 1. Comminuted fracture at the base of the first metacarpal with
intra-articular extension into the first carpometacarpal and
intercarpal articulation. Additional nondisplaced fractures
involving bases of the second and third metatarsals as well.
2. Questionable nondisplaced fracture versus vascular channel at the
volar and lateral aspect of the medial cuneiform and minimally
displaced fracture involving the volar aspect of the intermediate
cuneiform.
3. Widening of the Lisfranc interval up to approximately 9 mm
suspicious for Lisfranc injury particularly given the fracture
pattern as well as extent and mechanism of injury.
4. Extensive soft tissue swelling along the plantar arch and instep
and at the level of the volar aspect of the metatarsal and cuneiform
fractures. Intramuscular thickening and stranding along the central
plantar compartment. No retracted or torn tendons.

Currently attempting to contact the ordering provider with a
critical value result. Addendum will be submitted upon case
discussion.

## 2023-02-27 ENCOUNTER — Encounter: Payer: Self-pay | Admitting: Intensive Care

## 2023-02-27 ENCOUNTER — Emergency Department: Payer: 59

## 2023-02-27 ENCOUNTER — Emergency Department
Admission: EM | Admit: 2023-02-27 | Discharge: 2023-02-27 | Disposition: A | Discharge status: Home / Self Care | Payer: 59 | Attending: Emergency Medicine | Admitting: Emergency Medicine

## 2023-02-27 ENCOUNTER — Other Ambulatory Visit: Payer: Self-pay

## 2023-02-27 DIAGNOSIS — Z20822 Contact with and (suspected) exposure to covid-19: Secondary | ICD-10-CM | POA: Insufficient documentation

## 2023-02-27 DIAGNOSIS — R197 Diarrhea, unspecified: Secondary | ICD-10-CM | POA: Diagnosis not present

## 2023-02-27 DIAGNOSIS — R059 Cough, unspecified: Secondary | ICD-10-CM | POA: Diagnosis not present

## 2023-02-27 DIAGNOSIS — R110 Nausea: Secondary | ICD-10-CM | POA: Insufficient documentation

## 2023-02-27 DIAGNOSIS — J069 Acute upper respiratory infection, unspecified: Secondary | ICD-10-CM | POA: Insufficient documentation

## 2023-02-27 LAB — COMPREHENSIVE METABOLIC PANEL
ALT: 22 U/L (ref 0–44)
AST: 27 U/L (ref 15–41)
Albumin: 3.3 g/dL — ABNORMAL LOW (ref 3.5–5.0)
Alkaline Phosphatase: 43 U/L (ref 38–126)
Anion gap: 8 (ref 5–15)
BUN: 13 mg/dL (ref 6–20)
CO2: 24 mmol/L (ref 22–32)
Calcium: 8.4 mg/dL — ABNORMAL LOW (ref 8.9–10.3)
Chloride: 107 mmol/L (ref 98–111)
Creatinine, Ser: 0.84 mg/dL (ref 0.61–1.24)
GFR, Estimated: >60 mL/min (ref >60)
Glucose, Bld: 106 mg/dL — ABNORMAL HIGH (ref 70–99)
Potassium: 4 mmol/L (ref 3.5–5.1)
Sodium: 139 mmol/L (ref 135–145)
Total Bilirubin: 0.4 mg/dL (ref 0.0–1.2)
Total Protein: 6 g/dL — ABNORMAL LOW (ref 6.5–8.1)

## 2023-02-27 LAB — CBC WITH DIFFERENTIAL/PLATELET
Abs Immature Granulocytes: 0.02 10*3/uL (ref 0.00–0.07)
Basophils Absolute: 0.1 10*3/uL (ref 0.0–0.1)
Basophils Relative: 1 %
Eosinophils Absolute: 0 10*3/uL (ref 0.0–0.5)
Eosinophils Relative: 1 %
HCT: 45 % (ref 39.0–52.0)
Hemoglobin: 15.2 g/dL (ref 13.0–17.0)
Immature Granulocytes: 0 %
Lymphocytes Relative: 36 %
Lymphs Abs: 2.4 10*3/uL (ref 0.7–4.0)
MCH: 29.9 pg (ref 26.0–34.0)
MCHC: 33.8 g/dL (ref 30.0–36.0)
MCV: 88.4 fL (ref 80.0–100.0)
Monocytes Absolute: 0.6 10*3/uL (ref 0.1–1.0)
Monocytes Relative: 8 %
Neutro Abs: 3.6 10*3/uL (ref 1.7–7.7)
Neutrophils Relative %: 54 %
Platelets: 220 10*3/uL (ref 150–400)
RBC: 5.09 MIL/uL (ref 4.22–5.81)
RDW: 13.5 % (ref 11.5–15.5)
WBC: 6.6 10*3/uL (ref 4.0–10.5)
nRBC: 0 % (ref 0.0–0.2)

## 2023-02-27 LAB — TROPONIN I (HIGH SENSITIVITY): Troponin I (High Sensitivity): 4 ng/L (ref <18)

## 2023-02-27 LAB — RESP PANEL BY RT-PCR (RSV, FLU A&B, COVID)  RVPGX2
Influenza A by PCR: NEGATIVE
Influenza B by PCR: NEGATIVE
Resp Syncytial Virus by PCR: NEGATIVE
SARS Coronavirus 2 by RT PCR: NEGATIVE

## 2023-02-27 LAB — LIPASE, BLOOD: Lipase: 37 U/L (ref 11–51)

## 2023-02-27 LAB — GROUP A STREP BY PCR: Group A Strep by PCR: NOT DETECTED

## 2023-02-27 MED ORDER — LIDOCAINE 5 % EX PTCH
1.0000 | MEDICATED_PATCH | CUTANEOUS | Status: DC
  Administered 2023-02-27: 1 via TRANSDERMAL
  Filled 2023-02-27: qty 1

## 2023-02-27 MED ORDER — IBUPROFEN 600 MG PO TABS: 600.0000 mg | ORAL_TABLET | Freq: Three times a day (TID) | ORAL | 0 refills | Status: AC | PRN

## 2023-02-27 MED ORDER — LIDOCAINE 5 % EX PTCH: 1.0000 | MEDICATED_PATCH | Freq: Two times a day (BID) | CUTANEOUS | 0 refills | Status: AC

## 2023-02-27 MED ORDER — KETOROLAC TROMETHAMINE 15 MG/ML IJ SOLN
15.0000 mg | Freq: Once | INTRAMUSCULAR | Status: AC
  Administered 2023-02-27: 15 mg via INTRAMUSCULAR

## 2023-02-27 MED ORDER — BENZONATATE 100 MG PO CAPS: 100.0000 mg | ORAL_CAPSULE | Freq: Three times a day (TID) | ORAL | 0 refills | Status: AC | PRN

## 2023-02-27 MED ORDER — ONDANSETRON 4 MG PO TBDP: 4.0000 mg | ORAL_TABLET | Freq: Three times a day (TID) | ORAL | 0 refills | Status: AC | PRN

## 2023-02-27 MED ORDER — KETOROLAC TROMETHAMINE 15 MG/ML IJ SOLN
15.0000 mg | Freq: Once | INTRAMUSCULAR | Status: DC
  Filled 2023-02-27: qty 1

## 2023-02-27 NOTE — Discharge Instructions (Addendum)
 I suspect this is most likely a viral illness but you can take the medications to try to help with your symptoms and return to the ER if you develop worsening symptoms or any other concerns.

## 2023-02-27 NOTE — ED Provider Notes (Signed)
 [facility] Provider Note    Event Date/Time   First MD Initiated Contact with Patient 02/27/23 [phone]     (approximate)   History   Fever and Nasal Congestion   HPI  Evan Martin is a 43 y.o. male who is otherwise healthy who comes in with not feeling well for the past 5 days.  Patient reports having multiple symptoms including cough, sore throat, diarrhea, nausea.  States that he has been eating well today.  States that his other family members have been sick.  He states that he started develop some chest pain over the last few days as well.  Pain is mostly just with coughing.  He reports his sore throat is mostly with coughing as well.  Denies any risk factors for blood clots.  He denies any coughing up blood, swelling in his legs, hormone use, recent long travel, recent surgery.   Physical Exam   Triage Vital Signs: ED Triage Vitals  Encounter Vitals Group     BP 02/27/23 0808 105/83     Systolic BP Percentile --      Diastolic BP Percentile --      Pulse Rate 02/27/23 0808 61     Resp 02/27/23 0808 18     Temp 02/27/23 0803 98.5 F (36.9 C)     Temp Source 02/27/23 0803 Oral     SpO2 02/27/23 0808 100 %     Weight 02/27/23 0804 190 lb (86.2 kg)     Height 02/27/23 0804 6' 3 (1.905 m)     Head Circumference --      Peak Flow --      Pain Score 02/27/23 0804 0     Pain Loc --      Pain Education --      Exclude from Growth Chart --     Most recent vital signs: Vitals:   02/27/23 0803 02/27/23 0808  BP:  105/83  Pulse:  61  Resp:  18  Temp: 98.5 F (36.9 C)   SpO2:  100%     General: Awake, no distress.  CV:  Good peripheral perfusion.  Chest wall tenderness with palpation Resp:  Normal effort.  Clear lungs Abd:  No distention.  Soft and nontender Other:  No swelling in legs OP is clear.  No exudates noted.  No abscess noted  ED Results / Procedures / Treatments   Labs (all labs ordered are listed, but only abnormal results  are displayed) Labs Reviewed  RESP PANEL BY RT-PCR (RSV, FLU A&B, COVID)  [id number]     EKG  My interpretation of EKG:  Sinus bradycardia rate of 57 without any ST elevation or T wave inversions except V2, normal intervals  RADIOLOGY I have reviewed the xray personally and interpreted no evidence of any pneumonia  PROCEDURES:  Critical Care performed: No  Procedures   MEDICATIONS ORDERED IN ED: Medications  ketorolac  (TORADOL ) 15 MG/ML injection 15 mg (has no administration in time range)  lidocaine  (LIDODERM ) 5 % 1 patch (has no administration in time range)     IMPRESSION / MDM / ASSESSMENT AND PLAN / ED COURSE  I reviewed the triage vital signs and the nursing notes.   Patient's presentation is most consistent with acute presentation with potential threat to life or bodily function.   Patient comes in with multiple symptoms that suspect is most likely represents a viral illness but given patient does report some chest pain will get labs to  evaluate for Electra abnormalities, AKI, ACS.  Will get chest x-ray to evaluate for any pneumonia.  Low suspicion for PE patient is PERC negative.  Do not believe this is consistent with dissection given description of symptoms no indication for CT imaging.  Patient given some Toradol , lidocaine  patch to help with pain.  He denies taking any fever reducers before coming in he is afebrile here.  His abdomen is soft and nontender and low suspicion for acute abdominal pathology.  Patient is COVID, flu, RSV test are negative.  Chest x-ray without any evidence of pneumonia.  Lipase normal CBC reassuring CMP reassuring troponin negative and symptoms have been ongoing for 2 days per patient and low heart score- no indication for repeat trop  Patient feeling better and feels comfortable with discharge home suspect viral illness and discussed symptomatic treatment.      FINAL CLINICAL IMPRESSION(S) / ED DIAGNOSES   Final diagnoses:  Upper  respiratory tract infection, unspecified type     Rx / DC Orders   ED Discharge Orders     None        Note:  This document was prepared using Dragon voice recognition software and may include unintentional dictation errors.   Ernest Ronal BRAVO, MD 02/27/23 1000

## 2023-02-27 NOTE — ED Triage Notes (Signed)
 Patient c/o diarrhea, N/V, and fever that started yesterday.
